# Patient Record
Sex: Female | Born: 1995 | Race: White | Hispanic: No | Marital: Single | Smoking: Never smoker
Health system: Southern US, Community
[De-identification: ages and names within clinical notes are randomized; demographics above are authoritative.]

## PROBLEM LIST (undated history)

## (undated) DIAGNOSIS — M419 Scoliosis, unspecified: Secondary | ICD-10-CM

## (undated) DIAGNOSIS — F419 Anxiety disorder, unspecified: Secondary | ICD-10-CM

---
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
## 2017-02-15 ENCOUNTER — Other Ambulatory Visit: Payer: Self-pay

## 2017-02-15 ENCOUNTER — Encounter (HOSPITAL_BASED_OUTPATIENT_CLINIC_OR_DEPARTMENT_OTHER): Payer: Self-pay | Admitting: Emergency Medicine

## 2017-02-15 ENCOUNTER — Emergency Department (HOSPITAL_BASED_OUTPATIENT_CLINIC_OR_DEPARTMENT_OTHER)
Admission: EM | Admit: 2017-02-15 | Discharge: 2017-02-16 | Disposition: A | Discharge status: Home / Self Care | Payer: BC Managed Care – PPO | Attending: Emergency Medicine | Admitting: Emergency Medicine

## 2017-02-15 DIAGNOSIS — M5410 Radiculopathy, site unspecified: Secondary | ICD-10-CM | POA: Insufficient documentation

## 2017-02-15 DIAGNOSIS — R2689 Other abnormalities of gait and mobility: Secondary | ICD-10-CM | POA: Diagnosis not present

## 2017-02-15 DIAGNOSIS — M79604 Pain in right leg: Secondary | ICD-10-CM | POA: Diagnosis not present

## 2017-02-15 DIAGNOSIS — M545 Low back pain: Secondary | ICD-10-CM | POA: Diagnosis present

## 2017-02-15 DIAGNOSIS — Z79899 Other long term (current) drug therapy: Secondary | ICD-10-CM | POA: Insufficient documentation

## 2017-02-15 LAB — URINALYSIS, ROUTINE W REFLEX MICROSCOPIC
Bilirubin Urine: NEGATIVE
Glucose, UA: NEGATIVE mg/dL
Hgb urine dipstick: NEGATIVE
Ketones, ur: 15 mg/dL — AB
LEUKOCYTES UA: NEGATIVE
Nitrite: NEGATIVE
PH: 6 (ref 5.0–8.0)
Protein, ur: NEGATIVE mg/dL

## 2017-02-15 LAB — PREGNANCY, URINE: Preg Test, Ur: NEGATIVE

## 2017-02-15 MED ORDER — CYCLOBENZAPRINE HCL 10 MG PO TABS: 10.0000 mg | ORAL_TABLET | Freq: Three times a day (TID) | ORAL | 0 refills | Status: AC | PRN

## 2017-02-15 MED ORDER — CYCLOBENZAPRINE HCL 10 MG PO TABS
10.0000 mg | ORAL_TABLET | Freq: Once | ORAL | Status: AC
  Administered 2017-02-15: 10 mg via ORAL
  Filled 2017-02-15: qty 1

## 2017-02-15 MED ORDER — NAPROXEN 500 MG PO TABS: 500.0000 mg | ORAL_TABLET | Freq: Two times a day (BID) | ORAL | 0 refills | Status: AC

## 2017-02-15 MED ORDER — IBUPROFEN 400 MG PO TABS
600.0000 mg | ORAL_TABLET | Freq: Once | ORAL | Status: AC
  Administered 2017-02-15: 600 mg via ORAL
  Filled 2017-02-15: qty 1

## 2017-02-15 NOTE — ED Notes (Signed)
ED Provider at bedside. 

## 2017-02-15 NOTE — Discharge Instructions (Signed)
Naproxen as prescribed.  Flexeril as prescribed as needed for pain not relieved with naproxen.  Follow-up with your primary doctor if not improving in the next week to discuss physical therapy or possible imaging studies.

## 2017-02-15 NOTE — ED Provider Notes (Signed)
[facility] EMERGENCY DEPARTMENT Provider Note   CSN: [id number] Arrival date & time: 02/15/17  1933     History   Chief Complaint Chief Complaint  Patient presents with  . Back Pain    right leg    HPI Madison Maldonado is a 22 y.o. female.  Patient is a 22 year old female with past medical history of scoliosis.  She presents today for evaluation of right leg and right sided lumbar back pain.  This began today while she was walking and shopping.  She denies any weakness or numbness, but her gait is limited secondary to pain.  She denies any numbness or tingling.  She denies any bowel or bladder complaints.  She denies any specific injury.   The history is provided by the patient.  Back Pain   This is a new problem. Episode onset: 8 hours ago. The problem occurs constantly. The problem has not changed since onset.The pain is associated with no known injury. The pain is present in the lumbar spine. The quality of the pain is described as stabbing. The pain radiates to the right thigh. The pain is moderate. The symptoms are aggravated by bending and twisting. The pain is the same all the time. Pertinent negatives include no fever, no numbness, no bowel incontinence, no bladder incontinence, no paresthesias, no tingling and no weakness. She has tried nothing for the symptoms.    Past Medical History:  Diagnosis Date  . Anxiety   . Scoliosis     There are no active problems to display for this patient.   History reviewed. No pertinent surgical history.  OB History    No data available       Home Medications    Prior to Admission medications   Medication Sig Start Date End Date Taking? Authorizing Provider  ibuprofen (ADVIL,MOTRIN) 600 MG tablet Take 1 tablet (600 mg total) by mouth every 6 (six) hours as needed. 05/20/16   Bethel Born, PA-C  methocarbamol (ROBAXIN) 500 MG tablet Take 1 tablet (500 mg total) by mouth at bedtime and may repeat dose one time if  needed. 05/20/16   Bethel Born, PA-C  ondansetron (ZOFRAN ODT) 4 MG disintegrating tablet Take 1 tablet (4 mg total) by mouth every 8 (eight) hours as needed for nausea or vomiting. 05/20/16   Bethel Born, PA-C  sertraline (ZOLOFT) 25 MG tablet Take 75 mg by mouth daily.    [provider]  UNKNOWN TO PATIENT Birth control pills    [provider]    Family History History reviewed. No pertinent family history.  Social History Social History   Tobacco Use  . Smoking status: Never Smoker  . Smokeless tobacco: Never Used  Substance Use Topics  . Alcohol use: No  . Drug use: No     Allergies   Patient has no known allergies.   Review of Systems Review of Systems  Constitutional: Negative for fever.  Gastrointestinal: Negative for bowel incontinence.  Genitourinary: Negative for bladder incontinence.  Musculoskeletal: Positive for back pain.  Neurological: Negative for tingling, weakness, numbness and paresthesias.  All other systems reviewed and are negative.    Physical Exam Updated Vital Signs BP 124/80 (BP Location: Right Arm)   Pulse 88   Temp 98.6 F (37 C) (Oral)   Resp 16   Ht 5\' 8"  (1.727 m)   Wt 81.6 kg (180 lb)   LMP 01/15/2017   SpO2 100%   BMI 27.37 kg/m  Physical Exam  Constitutional: She is oriented to person, place, and time. She appears well-developed and well-nourished. No distress.  HENT:  Head: Normocephalic and atraumatic.  Neck: Normal range of motion. Neck supple.  Cardiovascular: Normal rate and regular rhythm. Exam reveals no gallop and no friction rub.  No murmur heard. Pulmonary/Chest: Effort normal and breath sounds normal. No respiratory distress. She has no wheezes.  Abdominal: Soft. Bowel sounds are normal. She exhibits no distension. There is no tenderness.  Musculoskeletal: Normal range of motion.  There is tenderness to palpation in the soft tissues of the right lower lumbar region.  Neurological:  She is alert and oriented to person, place, and time.  Strength is 5 out of 5 in both lower extremities.  DTRs are 2+ and symmetrical in the patellar and Achilles tendons bilaterally.  She is able to walk, however with an antalgic gait.  Skin: Skin is warm and dry. She is not diaphoretic.  Nursing note and vitals reviewed.    ED Treatments / Results  Labs (all labs ordered are listed, but only abnormal results are displayed) Labs Reviewed  URINALYSIS, ROUTINE W REFLEX MICROSCOPIC - Abnormal; Notable for the following components:      Result Value   Specific Gravity, Urine <1.005 (*)    Ketones, ur 15 (*)    All other components within normal limits  PREGNANCY, URINE    EKG  EKG Interpretation None       Radiology No results found.  Procedures Procedures (including critical care time)  Medications Ordered in ED Medications - No data to display   Initial Impression / Assessment and Plan / ED Course  I have reviewed the triage vital signs and the nursing notes.  Pertinent labs & imaging results that were available during my care of the patient were reviewed by me and considered in my medical decision making (see chart for details).  Patient presents with radicular low back pain and a history of scoliosis.  There are no red flags that would suggest an emergent situation.  She has equal strength, is ambulatory, and has no bowel or bladder issues.  I see no indication for urgent imaging or other intervention.  At this point she will be prescribed anti-inflammatories, muscle relaxers, and will have her follow-up with her primary doctor if she is not improving in the next week to discuss physical therapy or possible imaging.  Final Clinical Impressions(s) / ED Diagnoses   Final diagnoses:  None    ED Discharge Orders    None       Geoffery Lyonselo, Baley Shands, MD 02/15/17 2342

## 2017-02-15 NOTE — ED Triage Notes (Signed)
Patient states that she was having a very normal day and then went shopping and started to feel dizzy and having movents of nausea and grimacing in pain  - burning to her right leg

## 2017-02-15 NOTE — ED Notes (Signed)
Pt c/o right lower back pain that started at her thigh and radiated upward.  Pt also c/o headache with some dizziness, nausea and photophobia

## 2017-02-16 NOTE — ED Notes (Signed)
Pt verbalizes understanding of d/c instructions and denies any further needs at this time. 

## 2021-12-16 ENCOUNTER — Ambulatory Visit: Admit: 2021-12-16 | Discharge status: Against Medical Advice | Payer: Self-pay

## 2021-12-17 ENCOUNTER — Ambulatory Visit: Payer: Self-pay
